# Patient Record
Sex: Female | Born: 1960 | Race: Black or African American | Hispanic: No | Marital: Married | Smoking: Never smoker
Health system: Southern US, Community
[De-identification: ages and names within clinical notes are randomized; demographics above are authoritative.]

## PROBLEM LIST (undated history)

## (undated) DIAGNOSIS — G43909 Migraine, unspecified, not intractable, without status migrainosus: Secondary | ICD-10-CM

## (undated) DIAGNOSIS — T7840XA Allergy, unspecified, initial encounter: Secondary | ICD-10-CM

## (undated) DIAGNOSIS — Z8619 Personal history of other infectious and parasitic diseases: Secondary | ICD-10-CM

## (undated) DIAGNOSIS — R011 Cardiac murmur, unspecified: Secondary | ICD-10-CM

## (undated) DIAGNOSIS — Z5189 Encounter for other specified aftercare: Secondary | ICD-10-CM

## (undated) DIAGNOSIS — M1990 Unspecified osteoarthritis, unspecified site: Secondary | ICD-10-CM

## (undated) DIAGNOSIS — E7800 Pure hypercholesterolemia, unspecified: Secondary | ICD-10-CM

## (undated) HISTORY — DX: Allergy, unspecified, initial encounter: T78.40XA

## (undated) HISTORY — DX: Unspecified osteoarthritis, unspecified site: M19.90

## (undated) HISTORY — PX: TUBAL LIGATION: SHX77

## (undated) HISTORY — PX: EYE SURGERY: SHX253

## (undated) HISTORY — DX: Migraine, unspecified, not intractable, without status migrainosus: G43.909

## (undated) HISTORY — DX: Personal history of other infectious and parasitic diseases: Z86.19

## (undated) HISTORY — DX: Cardiac murmur, unspecified: R01.1

## (undated) HISTORY — DX: Encounter for other specified aftercare: Z51.89

## (undated) HISTORY — DX: Pure hypercholesterolemia, unspecified: E78.00

---

## 2008-06-14 ENCOUNTER — Encounter: Admission: RE | Admit: 2008-06-14 | Discharge: 2008-06-14 | Payer: Self-pay | Admitting: Internal Medicine

## 2008-11-03 ENCOUNTER — Encounter: Admission: RE | Admit: 2008-11-03 | Discharge: 2008-11-03 | Payer: Self-pay | Admitting: Internal Medicine

## 2011-06-07 ENCOUNTER — Ambulatory Visit: Payer: Self-pay | Admitting: Family Medicine

## 2011-07-22 ENCOUNTER — Ambulatory Visit: Payer: Self-pay | Admitting: Family Medicine

## 2011-09-02 ENCOUNTER — Encounter: Payer: Self-pay | Admitting: Obstetrics & Gynecology

## 2011-09-02 DIAGNOSIS — Z01419 Encounter for gynecological examination (general) (routine) without abnormal findings: Secondary | ICD-10-CM

## 2012-01-29 ENCOUNTER — Emergency Department (INDEPENDENT_AMBULATORY_CARE_PROVIDER_SITE_OTHER)
Admission: EM | Admit: 2012-01-29 | Discharge: 2012-01-29 | Disposition: A | Discharge status: Home / Self Care | Payer: Self-pay | Source: Home / Self Care | Attending: Family Medicine | Admitting: Family Medicine

## 2012-01-29 ENCOUNTER — Encounter (HOSPITAL_COMMUNITY): Payer: Self-pay

## 2012-01-29 DIAGNOSIS — S46819A Strain of other muscles, fascia and tendons at shoulder and upper arm level, unspecified arm, initial encounter: Secondary | ICD-10-CM

## 2012-01-29 MED ORDER — HYDROCODONE-ACETAMINOPHEN 5-325 MG PO TABS: ORAL_TABLET | ORAL | Status: AC

## 2012-01-29 MED ORDER — NAPROXEN 375 MG PO TABS: 375.0000 mg | ORAL_TABLET | Freq: Two times a day (BID) | ORAL | Status: DC

## 2012-01-29 MED ORDER — NAPROXEN 375 MG PO TABS: 375.0000 mg | ORAL_TABLET | Freq: Two times a day (BID) | ORAL | Status: AC

## 2012-01-29 NOTE — Discharge Instructions (Signed)
Take medications as directed; use the naproxen for baseline pain control; take the hydrocodone ONLY as needed for pain not relieved by the naproxen, and do not drive or work while taking. Rest the arm when you can; limit your use and lifting of heavy objects for the next 5 to 7 days. You may use your arm for otherwise regular daily activities. Return to care should your symptoms not improve, or worsen in any way, such as numbness, tingling, or weakness.
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
## 2013-05-25 ENCOUNTER — Ambulatory Visit: Payer: Self-pay | Admitting: Obstetrics and Gynecology

## 2016-10-07 ENCOUNTER — Emergency Department
Admission: EM | Admit: 2016-10-07 | Discharge: 2016-10-07 | Disposition: A | Discharge status: Home / Self Care | Payer: BC Managed Care – PPO | Attending: Emergency Medicine | Admitting: Emergency Medicine

## 2016-10-07 ENCOUNTER — Emergency Department: Payer: BC Managed Care – PPO

## 2016-10-07 DIAGNOSIS — J069 Acute upper respiratory infection, unspecified: Secondary | ICD-10-CM | POA: Insufficient documentation

## 2016-10-07 DIAGNOSIS — J40 Bronchitis, not specified as acute or chronic: Secondary | ICD-10-CM

## 2016-10-07 DIAGNOSIS — R0789 Other chest pain: Secondary | ICD-10-CM | POA: Diagnosis present

## 2016-10-07 DIAGNOSIS — Z79899 Other long term (current) drug therapy: Secondary | ICD-10-CM | POA: Insufficient documentation

## 2016-10-07 LAB — BASIC METABOLIC PANEL
ANION GAP: 7 (ref 5–15)
BUN: 12 mg/dL (ref 6–20)
CALCIUM: 9.5 mg/dL (ref 8.9–10.3)
CHLORIDE: 103 mmol/L (ref 101–111)
CO2: 30 mmol/L (ref 22–32)
CREATININE: 0.75 mg/dL (ref 0.44–1.00)
GFR calc Af Amer: >60 mL/min (ref >60)
GLUCOSE: 85 mg/dL (ref 65–99)
POTASSIUM: 4.2 mmol/L (ref 3.5–5.1)
Sodium: 140 mmol/L (ref 135–145)

## 2016-10-07 LAB — TROPONIN I

## 2016-10-07 LAB — CBC
HCT: 37 % (ref 35.0–47.0)
Hemoglobin: 12.5 g/dL (ref 12.0–16.0)
MCH: 29.6 pg (ref 26.0–34.0)
MCHC: 33.9 g/dL (ref 32.0–36.0)
MCV: 87.2 fL (ref 80.0–100.0)
PLATELETS: 274 10*3/uL (ref 150–440)
RBC: 4.24 MIL/uL (ref 3.80–5.20)
RDW: 13.5 % (ref 11.5–14.5)
WBC: 4.1 10*3/uL (ref 3.6–11.0)

## 2016-10-07 MED ORDER — AZITHROMYCIN 250 MG PO TABS: ORAL_TABLET | ORAL | 0 refills | Status: AC

## 2016-10-07 MED ORDER — AZITHROMYCIN 250 MG PO TABS: ORAL_TABLET | ORAL | 0 refills | Status: DC

## 2016-10-07 MED ORDER — GUAIFENESIN-CODEINE 100-10 MG/5ML PO SOLN: 5.0000 mL | Freq: Four times a day (QID) | ORAL | 0 refills | Status: DC | PRN

## 2016-10-07 MED ORDER — BENZONATATE 100 MG PO CAPS: 100.0000 mg | ORAL_CAPSULE | Freq: Four times a day (QID) | ORAL | 0 refills | Status: AC | PRN

## 2016-10-07 NOTE — ED Triage Notes (Signed)
Pt to ed with c/o chest pain, sob, dizziness and cough, congestion since Saturday.  Pt states chest pain and sob would get worse with ambulation or activity.  Pt also reports sinus congestion.

## 2016-10-07 NOTE — ED Provider Notes (Signed)
[facility] Emergency Department Provider Note  Time seen: 12:06 PM  I have reviewed the triage vital signs and the nursing notes.   HISTORY  Chief Complaint Chest Pain    HPI Olivia Pennington is a 55 y.o. female who presents to the emergency department for cough, congestion and chest discomfort. According to the patient for the past 2 weeks she has been coughing with sinus congestion. States over the past several days she has been experiencing chest discomfort as well although denies any chest discomfort yesterday or today. States it feels like a discomfort in her airway per patient. [facility] and was sent to the ER for further evaluation. Denies any shortness of breath, nausea or diaphoresis. Denies any leg pain or swelling. Currently patient appears well with no complaints besides cough/congestion.  No past medical history on file.  There are no active problems to display for this patient.   No past surgical history on file.  Prior to Admission medications   Medication Sig Start Date End Date Taking? Authorizing Provider  calcium carbonate (OS-CAL - DOSED IN MG OF ELEMENTAL CALCIUM) 1250 (500 Ca) MG tablet Take 1 tablet by mouth.   Yes Historical Provider, MD  Chlorpheniramine-DM (CORICIDIN HBP COUGH/COLD PO) Take 1 tablet by mouth daily as needed.   Yes Historical Provider, MD  ferrous sulfate 325 (65 FE) MG EC tablet Take 325 mg by mouth 3 (three) times daily with meals.   Yes Historical Provider, MD  Nutritional Supplements (COLD AND FLU PO) Take 1 tablet by mouth as needed.   Yes Historical Provider, MD  vitamin C (ASCORBIC ACID) 500 MG tablet Take 500 mg by mouth daily.   Yes Historical Provider, MD    No Known Allergies  No family history on file.  Social History Social History  Substance Use Topics  . Smoking status: Not on file  . Smokeless tobacco: Not on file  . Alcohol use Not on file    Review of  Systems Constitutional: Negative for fever. Cardiovascular: Negative for chest pain. Respiratory: Negative for shortness of breath.Positive for cough. Gastrointestinal: Negative for abdominal pain Musculoskeletal: Negative for back pain. Neurological: Negative for headache 10-point ROS otherwise negative.  ____________________________________________   PHYSICAL EXAM:  VITAL SIGNS: ED Triage Vitals  Enc Vitals Group     BP 10/07/16 1000 (!) 175/104     Pulse Rate 10/07/16 1000 63     Resp 10/07/16 1000 20     Temp 10/07/16 1000 98.3 F (36.8 C)     Temp Source 10/07/16 1000 Oral     SpO2 10/07/16 1000 100 %     Weight 10/07/16 0959 160 lb (72.6 kg)     Height 10/07/16 0959 5\' 2"  (1.575 m)     Head Circumference --      Peak Flow --      Pain Score 10/07/16 1000 0     Pain Loc --      Pain Edu? --      Excl. in GC? --     Constitutional: Alert and oriented. Well appearing and in no distress. Eyes: Normal exam ENT   Head: Normocephalic and atraumatic.   Mouth/Throat: Mucous membranes are moist. Cardiovascular: Normal rate, regular rhythm. No murmur Respiratory: Normal respiratory effort without tachypnea nor retractions. Breath sounds are clear Gastrointestinal: Soft and nontender. No distention.   Musculoskeletal: Nontender with normal range of motion in all extremities. No lower extremity tenderness or edema.  Neurologic:  Normal speech and language. No gross focal neurologic deficits  Skin:  Skin is warm, dry and intact.  Psychiatric: Mood and affect are normal.   ____________________________________________    EKG  EKG reviewed and interpreted by myself shows normal sinus rhythm at 64 bpm, narrow QRS, normal axis, normal intervals, nonspecific but no concerning ST changes.  ____________________________________________    RADIOLOGY  Chest x-ray is clear  ____________________________________________   INITIAL IMPRESSION / ASSESSMENT AND PLAN / ED  COURSE  Pertinent labs & imaging results that were available during my care of the patient were reviewed by me and considered in my medical decision making (see chart for details).  Patient presents to the emergency department for cough, congestion, sinus pain/pressure and chest discomfort over the past 2 weeks. Patient's chest x-ray is clear, physical examination is normal with an occasional cough. Labs are within normal limits including a negative troponin. Highly suspect upper respiratory infection/bronchitis. As the patient's symptoms have been ongoing for 2 weeks we'll cover with Zithromax and have the patient follow up with her primary care doctor. Patient agreeable to this plan.  ____________________________________________   FINAL CLINICAL IMPRESSION(S) / ED DIAGNOSES  Acute bronchitis    Minna AntisKevin Rinoa Garramone, MD 10/07/16 1209

## 2017-05-26 ENCOUNTER — Encounter: Payer: Self-pay | Admitting: Obstetrics and Gynecology

## 2017-05-26 ENCOUNTER — Ambulatory Visit (INDEPENDENT_AMBULATORY_CARE_PROVIDER_SITE_OTHER): Payer: BC Managed Care – PPO | Admitting: Obstetrics and Gynecology

## 2017-05-26 VITALS — BP 126/78 | Ht 62.0 in | Wt 176.0 lb

## 2017-05-26 DIAGNOSIS — Z01419 Encounter for gynecological examination (general) (routine) without abnormal findings: Secondary | ICD-10-CM | POA: Diagnosis not present

## 2017-05-26 DIAGNOSIS — Z1339 Encounter for screening examination for other mental health and behavioral disorders: Secondary | ICD-10-CM

## 2017-05-26 DIAGNOSIS — Z124 Encounter for screening for malignant neoplasm of cervix: Secondary | ICD-10-CM | POA: Diagnosis not present

## 2017-05-26 DIAGNOSIS — Z1331 Encounter for screening for depression: Secondary | ICD-10-CM

## 2017-05-26 DIAGNOSIS — Z1389 Encounter for screening for other disorder: Secondary | ICD-10-CM | POA: Diagnosis not present

## 2017-05-26 NOTE — Progress Notes (Signed)
Routine Annual Gynecology Examination   PCP: Patient, No Pcp Per  Chief Complaint  Patient presents with  . Annual Exam    History of Present Illness: Patient is a 56 y.o. [id number] presents for annual exam. The patient has no complaints today.   Menopausal bleeding: no menses since February-March  Menopausal symptoms: reports hot flashes daily, lasting 5-20 minutes. Does not want medication for her symptoms.  Breast symptoms: denies  Last pap smear: 4 years ago.  Result Normal  Last mammogram: 4 years ago.  Result Normal  Past Medical History:  Diagnosis Date  . Hypercholesteremia     Past Surgical History:  Procedure Laterality Date  . CESAREAN SECTION    . EYE SURGERY    . TUBAL LIGATION        Medication Sig Start Date End Date Taking? Authorizing Provider  calcium carbonate (OS-CAL - DOSED IN MG OF ELEMENTAL CALCIUM) 1250 (500 Ca) MG tablet Take 1 tablet by mouth.   Yes [provider]  Nutritional Supplements (COLD AND FLU PO) Take 1 tablet by mouth as needed.   Yes [provider]  vitamin C (ASCORBIC ACID) 500 MG tablet Take 500 mg by mouth daily.   Yes [provider]  Chlorpheniramine-DM (CORICIDIN HBP COUGH/COLD PO) Take 1 tablet by mouth daily as needed.    [provider]  ferrous sulfate 325 (65 FE) MG EC tablet Take 325 mg by mouth 3 (three) times daily with meals.    [provider]   Allergies: No Known Allergies  Obstetric History: [id number]: [id number]  Social History   Social History  . Marital status: Married    Spouse name: N/A  . Number of children: N/A  . Years of education: N/A   Occupational History  . Not on file.   Social History Main Topics  . Smoking status: Never Smoker  . Smokeless tobacco: Never Used  . Alcohol use No  . Drug use: No  . Sexual activity: Yes   Other Topics Concern  . Not on file   Social History Narrative  . No narrative on file    Family History  Problem Relation  Age of Onset  . Colon cancer Father    Review of Systems  Constitutional: Negative for chills, diaphoresis, fever, malaise/fatigue and weight loss.       Hot flashes  HENT: Negative.   Eyes: Negative.   Respiratory: Negative.   Cardiovascular: Negative.   Gastrointestinal: Negative.   Genitourinary: Negative.   Musculoskeletal: Negative.   Skin: Negative.   Neurological: Negative for weakness.  Endo/Heme/Allergies: Negative.   Psychiatric/Behavioral: Negative.      Physical Exam Vitals: BP 126/78   Ht 5\' 2"  (1.575 m)   Wt 176 lb (79.8 kg)   LMP 12/26/2016   BMI 32.19 kg/m   Physical Exam  Constitutional: She is oriented to person, place, and time. She appears well-developed and well-nourished. No distress.  Genitourinary: Vagina normal and uterus normal. Pelvic exam was performed with patient supine. There is no rash, tenderness or lesion on the right labia. There is no rash, tenderness or lesion on the left labia. Right adnexum does not display mass, does not display tenderness and does not display fullness. Left adnexum does not display mass, does not display tenderness and does not display fullness. Cervix does not exhibit motion tenderness, lesion, discharge or polyp.   Uterus is mobile and anteverted. Uterus is not enlarged, tender, exhibiting a mass or irregular (is regular).  HENT:  Head: Normocephalic and atraumatic.  Eyes: EOM are normal. No scleral icterus.  Neck: Normal range of motion. Neck supple. No thyromegaly present.  Cardiovascular: Normal rate and regular rhythm.   Pulmonary/Chest: Effort normal and breath sounds normal. No respiratory distress. She has no wheezes. She has no rales.  Abdominal: Soft. Bowel sounds are normal. She exhibits no distension and no mass. There is no tenderness. There is no rebound and no guarding.  Musculoskeletal: Normal range of motion. She exhibits no edema.  Lymphadenopathy:    She has no cervical adenopathy.  Neurological:  She is alert and oriented to person, place, and time. No cranial nerve deficit.  Skin: Skin is warm and dry. No erythema.  Psychiatric: She has a normal mood and affect. Her behavior is normal. Judgment normal.   Female chaperone present for pelvic and breast  portions of the physical exam  Results: AUDIT Questionnaire (screen for alcoholism): 2 PHQ-9: 0  Assessment and Plan:  56 y.o. [id number] female here for routine annual gynecologic examination  Plan: Problem List Items Addressed This Visit    None    Visit Diagnoses    Women's annual routine gynecological examination    -  Primary   Relevant Orders   IGP, Aptima HPV, rfx 16/18,45   Pap smear for cervical cancer screening       Relevant Orders   IGP, Aptima HPV, rfx 16/18,45   Screening for depression       Screening for alcoholism         Screening: -- Blood pressure screen normal -- Colonoscopy - due - managed by PCP -- Mammogram - due. Patient to call [facility] to arrange. She understands that it is her responsibility to arrange this. -- Weight screening: obese: discussed management options, including lifestyle, dietary, and exercise. -- Depression screening negative (PHQ-9) -- Nutrition: normal -- cholesterol screening: per PCP -- osteoporosis screening: not due -- tobacco screening: not using -- alcohol screening: AUDIT questionnaire indicates low-risk usage. -- family history of breast cancer screening: done. not at high risk. -- no evidence of domestic violence or intimate partner violence. -- STD screening: gonorrhea/chlamydia NAAT not collected per patient request. -- pap smear collected per ASCCP guidelines -- HPV vaccination series: not eligilbe  Thomasene Mohair, MD 05/26/2017 5:24 PM

## 2017-05-28 ENCOUNTER — Other Ambulatory Visit: Payer: Self-pay | Admitting: Obstetrics and Gynecology

## 2017-05-28 DIAGNOSIS — Z1231 Encounter for screening mammogram for malignant neoplasm of breast: Secondary | ICD-10-CM

## 2017-05-30 LAB — IGP, APTIMA HPV, RFX 16/18,45
HPV Aptima: NEGATIVE
PAP SMEAR COMMENT: 0

## 2017-06-02 ENCOUNTER — Encounter: Payer: Self-pay | Admitting: Obstetrics and Gynecology

## 2017-06-16 ENCOUNTER — Ambulatory Visit
Admission: RE | Admit: 2017-06-16 | Discharge: 2017-06-16 | Disposition: A | Discharge status: Home / Self Care | Payer: BC Managed Care – PPO | Source: Ambulatory Visit | Attending: Obstetrics and Gynecology | Admitting: Obstetrics and Gynecology

## 2017-06-16 DIAGNOSIS — Z1231 Encounter for screening mammogram for malignant neoplasm of breast: Secondary | ICD-10-CM | POA: Diagnosis not present

## 2018-05-14 ENCOUNTER — Other Ambulatory Visit: Payer: Self-pay | Admitting: Obstetrics and Gynecology

## 2018-06-08 ENCOUNTER — Encounter: Payer: Self-pay | Admitting: Obstetrics and Gynecology

## 2018-06-08 ENCOUNTER — Other Ambulatory Visit (HOSPITAL_COMMUNITY)
Admission: RE | Admit: 2018-06-08 | Discharge: 2018-06-08 | Disposition: A | Discharge status: Home / Self Care | Payer: BC Managed Care – PPO | Source: Ambulatory Visit | Attending: Obstetrics and Gynecology | Admitting: Obstetrics and Gynecology

## 2018-06-08 ENCOUNTER — Ambulatory Visit (INDEPENDENT_AMBULATORY_CARE_PROVIDER_SITE_OTHER): Payer: BC Managed Care – PPO | Admitting: Obstetrics and Gynecology

## 2018-06-08 VITALS — Ht 62.0 in | Wt 174.0 lb

## 2018-06-08 DIAGNOSIS — E7800 Pure hypercholesterolemia, unspecified: Secondary | ICD-10-CM | POA: Insufficient documentation

## 2018-06-08 DIAGNOSIS — Z01419 Encounter for gynecological examination (general) (routine) without abnormal findings: Secondary | ICD-10-CM | POA: Diagnosis present

## 2018-06-08 DIAGNOSIS — Z1331 Encounter for screening for depression: Secondary | ICD-10-CM | POA: Diagnosis not present

## 2018-06-08 DIAGNOSIS — Z124 Encounter for screening for malignant neoplasm of cervix: Secondary | ICD-10-CM

## 2018-06-08 DIAGNOSIS — Z1339 Encounter for screening examination for other mental health and behavioral disorders: Secondary | ICD-10-CM

## 2018-06-08 DIAGNOSIS — Z1211 Encounter for screening for malignant neoplasm of colon: Secondary | ICD-10-CM

## 2018-06-08 NOTE — Progress Notes (Signed)
Routine Annual Gynecology Examination   PCP: [facility]  Chief Complaint  Patient presents with  . Gynecologic Exam   History of Present Illness: Patient is a 57 y.o. [id number] presents for annual exam. The patient has no complaints today.   Menopausal bleeding: No menses in about 9 months.    Menopausal symptoms: hot flashes x 3 years.  She gets these every day and night.  She sleeps well.   She believes her husband might say she has irritability.   Breast symptoms: denies  Last pap smear: 1 years ago.  Result Normal, HPV negative  Last mammogram: 1 year ago.  Result Normal  Colon cancer screening: has not had yet. She canceled her appointment because she did not have a person to take her to the hospital.    Past Medical History:  Diagnosis Date  . Hypercholesteremia     Past Surgical History:  Procedure Laterality Date  . CESAREAN SECTION    . EYE SURGERY    . TUBAL LIGATION      Medications   Medication Sig Start Date End Date Taking? Authorizing Provider  Multiple Vitamin (MULTIVITAMINS PO) Take by mouth.   Yes [provider]   Allergies: No Known Allergies  Obstetric History: [id number]  Social History   Socioeconomic History  . Marital status: Married    Spouse name: Not on file  . Number of children: Not on file  . Years of education: Not on file  . Highest education level: Not on file  Occupational History  . Not on file  Social Needs  . Financial resource strain: Not on file  . Food insecurity:    Worry: Not on file    Inability: Not on file  . Transportation needs:    Medical: Not on file    Non-medical: Not on file  Tobacco Use  . Smoking status: Never Smoker  . Smokeless tobacco: Never Used  Substance and Sexual Activity  . Alcohol use: No  . Drug use: No  . Sexual activity: Yes  Lifestyle  . Physical activity:    Days per week: 5 days    Minutes per session: Not on file  . Stress: Not on file  Relationships    . Social connections:    Talks on phone: Not on file    Gets together: Not on file    Attends religious service: Not on file    Active member of club or organization: Not on file    Attends meetings of clubs or organizations: Not on file    Relationship status: Not on file  . Intimate partner violence:    Fear of current or ex partner: Not on file    Emotionally abused: Not on file    Physically abused: Not on file    Forced sexual activity: Not on file  Other Topics Concern  . Not on file  Social History Narrative  . Not on file    Family History  Problem Relation Age of Onset  . Colon cancer Father   . Breast cancer Neg Hx     Review of Systems  Constitutional: Negative.   HENT: Negative.   Eyes: Negative.   Respiratory: Negative.   Cardiovascular: Negative.   Gastrointestinal: Negative.   Genitourinary: Negative.   Musculoskeletal: Negative.   Skin: Negative.   Neurological: Negative.   Psychiatric/Behavioral: Negative.      Physical Exam Vitals: Ht 5\' 2"  (1.575 m)   Wt 174 lb (78.9 kg)  BMI 31.83 kg/m   Physical Exam  Constitutional: She is oriented to person, place, and time. She appears well-developed and well-nourished. No distress.  Genitourinary: Uterus normal. Pelvic exam was performed with patient supine. There is no rash, tenderness, lesion or injury on the right labia. There is no rash, tenderness, lesion or injury on the left labia. No erythema, tenderness or bleeding in the vagina. No signs of injury around the vagina. No vaginal discharge found. Right adnexum does not display mass, does not display tenderness and does not display fullness. Left adnexum does not display mass, does not display tenderness and does not display fullness. Cervix does not exhibit motion tenderness, lesion, discharge or polyp.   Uterus is mobile and anteverted. Uterus is not enlarged, tender or exhibiting a mass.  HENT:  Head: Normocephalic and atraumatic.  Eyes: EOM are  normal. No scleral icterus.  Neck: Normal range of motion. Neck supple. No thyromegaly present.  Cardiovascular: Normal rate and regular rhythm. Exam reveals no gallop and no friction rub.  No murmur heard. Pulmonary/Chest: Effort normal and breath sounds normal. No respiratory distress. She has no wheezes. She has no rales. Right breast exhibits no inverted nipple, no mass, no nipple discharge, no skin change and no tenderness. Left breast exhibits no inverted nipple, no mass, no nipple discharge, no skin change and no tenderness.  Abdominal: Soft. Bowel sounds are normal. She exhibits no distension and no mass. There is no tenderness. There is no rebound and no guarding.  Musculoskeletal: Normal range of motion. She exhibits no edema or tenderness.  Lymphadenopathy:    She has no cervical adenopathy.       Right: No inguinal adenopathy present.       Left: No inguinal adenopathy present.  Neurological: She is alert and oriented to person, place, and time. No cranial nerve deficit.  Skin: Skin is warm and dry. No rash noted. No erythema.  Psychiatric: She has a normal mood and affect. Her behavior is normal. Judgment normal.   Female chaperone present for pelvic and breast  portions of the physical exam  Results: AUDIT Questionnaire (screen for alcoholism): 1 PHQ-9: 0  Assessment and Plan:  57 y.o. [id number] female here for routine annual gynecologic examination  Plan: Problem List Items Addressed This Visit    None    Visit Diagnoses    Women's annual routine gynecological examination    -  Primary   Relevant Orders   Cytology - PAP   Screening for depression       Screening for alcoholism       Colon cancer screening       Pap smear for cervical cancer screening       Relevant Orders   Cytology - PAP    Cologuard ordered for patient.   Screening: -- Blood pressure screen managed by PCP -- Colonoscopy - discussed in detail. Discussed Cologard as alternative. Given her  family history, I recommended Colonoscopy.   -- Mammogram - due. Patient to call [facility] to arrange. She understands that it is her responsibility to arrange this. -- Weight screening: obese: discussed management options, including lifestyle, dietary, and exercise. -- Depression screening negative (PHQ-9) -- Nutrition: normal -- cholesterol screening: per PCP -- osteoporosis screening: not due -- tobacco screening: not using -- alcohol screening: AUDIT questionnaire indicates low-risk usage. -- family history of breast cancer screening: done. not at high risk. -- no evidence of domestic violence or intimate partner violence. -- STD screening: gonorrhea/chlamydia NAAT not  collected per patient request. -- pap smear not collected per ASCCP guidelines -- HPV vaccination series: not eligilbe  Thomasene Mohair, MD 06/08/2018 10:18 AM

## 2018-06-09 ENCOUNTER — Other Ambulatory Visit: Payer: Self-pay | Admitting: Obstetrics and Gynecology

## 2018-06-09 DIAGNOSIS — Z1231 Encounter for screening mammogram for malignant neoplasm of breast: Secondary | ICD-10-CM

## 2018-06-10 ENCOUNTER — Encounter: Payer: Self-pay | Admitting: Obstetrics and Gynecology

## 2018-06-10 LAB — CYTOLOGY - PAP
Diagnosis: NEGATIVE
HPV: NOT DETECTED

## 2018-06-30 ENCOUNTER — Telehealth: Payer: Self-pay

## 2018-06-30 ENCOUNTER — Telehealth: Payer: Self-pay | Admitting: Obstetrics and Gynecology

## 2018-06-30 NOTE — Telephone Encounter (Signed)
I did receive the results and have left a voicemail for the patient to call me back to discuss next steps.

## 2018-06-30 NOTE — Telephone Encounter (Signed)
[employer] calling to confirm you received the ABNORMAL/POSITIVE Cologard results that were faxed on this patient the end of last week.

## 2018-06-30 NOTE — Telephone Encounter (Signed)
Left generic VM for patient to call me back. 

## 2018-07-01 NOTE — Telephone Encounter (Signed)
Patient is calling to speak with dr. Jean RosenthalJackson. Please advise

## 2018-07-02 NOTE — Telephone Encounter (Signed)
Patient is calling to let Dr. Jean RosenthalJackson know the best time to reach her is anytime between 10:30-1:30 and after 4.

## 2018-07-09 NOTE — Telephone Encounter (Signed)
This was sent to me while I was still on maternity leave, have you spoke with pt?

## 2018-07-09 NOTE — Telephone Encounter (Signed)
Spoke with patient. Discussed positive Cologuard result.  Strongly recommend colonoscopy. She would like to have it done at [facility]. She states that she will call and schedule. I offered and encouraged her to let me make a referral.  She states that if after speaking with [facility], she needs a referral, she will let me know.

## 2018-07-23 NOTE — Telephone Encounter (Signed)
Spoke with pt. Pt will call [facility] to schedule colonoscopy and will let us know if she needs referral

## 2018-07-23 NOTE — Telephone Encounter (Signed)
Patient is calling due to multiple messages left. Patient would like to have Dr. Jean RosenthalJackson Nurse call her back to help her understand the next steps for her to schedule her Colonoscopy. Please advise

## 2018-11-23 ENCOUNTER — Ambulatory Visit
Admission: RE | Admit: 2018-11-23 | Discharge: 2018-11-23 | Disposition: A | Discharge status: Home / Self Care | Payer: BC Managed Care – PPO | Source: Ambulatory Visit | Attending: Obstetrics and Gynecology | Admitting: Obstetrics and Gynecology

## 2018-11-23 DIAGNOSIS — Z1231 Encounter for screening mammogram for malignant neoplasm of breast: Secondary | ICD-10-CM | POA: Diagnosis present

## 2020-05-22 ENCOUNTER — Ambulatory Visit: Payer: BC Managed Care – PPO | Admitting: Obstetrics and Gynecology

## 2020-06-26 ENCOUNTER — Ambulatory Visit: Payer: BC Managed Care – PPO | Admitting: Obstetrics and Gynecology

## 2020-07-05 ENCOUNTER — Ambulatory Visit: Payer: BC Managed Care – PPO | Admitting: Obstetrics and Gynecology

## 2020-08-04 ENCOUNTER — Ambulatory Visit (INDEPENDENT_AMBULATORY_CARE_PROVIDER_SITE_OTHER): Payer: BC Managed Care – PPO | Admitting: Obstetrics and Gynecology

## 2020-08-04 ENCOUNTER — Other Ambulatory Visit: Payer: Self-pay

## 2020-08-04 ENCOUNTER — Encounter: Payer: Self-pay | Admitting: Obstetrics and Gynecology

## 2020-08-04 VITALS — BP 130/90 | Ht 62.0 in | Wt 175.0 lb

## 2020-08-04 DIAGNOSIS — Z01419 Encounter for gynecological examination (general) (routine) without abnormal findings: Secondary | ICD-10-CM | POA: Diagnosis not present

## 2020-08-04 DIAGNOSIS — Z1339 Encounter for screening examination for other mental health and behavioral disorders: Secondary | ICD-10-CM

## 2020-08-04 DIAGNOSIS — Z1331 Encounter for screening for depression: Secondary | ICD-10-CM

## 2020-08-04 NOTE — Progress Notes (Signed)
Routine Annual Gynecology Examination   PCP: Patient, No Pcp Per  Chief Complaint  Patient presents with  . Gynecologic Exam    History of Present Illness: Patient is a 59 y.o. [id number] presents for annual exam. The patient has no complaints today.   Menopausal bleeding: none  Menopausal symptoms: hot flashes, night sweats  Breast symptoms: denies  Last pap smear: 2 years ago.  Result Normal  Last mammogram: 11/2018 years ago.  Result Normal   Last colonoscopy: 2 years ago.  Per patient 10 year follow up.   Past Medical History:  Diagnosis Date  . Hypercholesteremia     Past Surgical History:  Procedure Laterality Date  . CESAREAN SECTION    . EYE SURGERY    . TUBAL LIGATION     Prior to Admission medications   Medication Sig Start Date End Date Taking? Authorizing Provider  Multiple Vitamin (MULTIVITAMINS PO) Take by mouth.   Yes [provider]   Allergies: No Known Allergies  Obstetric History: [id number]  Social History   Socioeconomic History  . Marital status: Married    Spouse name: Not on file  . Number of children: Not on file  . Years of education: Not on file  . Highest education level: Not on file  Occupational History  . Not on file  Tobacco Use  . Smoking status: Never Smoker  . Smokeless tobacco: Never Used  Vaping Use  . Vaping Use: Never used  Substance and Sexual Activity  . Alcohol use: No  . Drug use: No  . Sexual activity: Yes  Other Topics Concern  . Not on file  Social History Narrative  . Not on file   Social Determinants of Health   Financial Resource Strain:   . Difficulty of Paying Living Expenses: Not on file  Food Insecurity:   . Worried About Programme researcher, broadcasting/film/video in the Last Year: Not on file  . Ran Out of Food in the Last Year: Not on file  Transportation Needs:   . Lack of Transportation (Medical): Not on file  . Lack of Transportation (Non-Medical): Not on file  Physical Activity:   . Days of  Exercise per Week: Not on file  . Minutes of Exercise per Session: Not on file  Stress:   . Feeling of Stress : Not on file  Social Connections:   . Frequency of Communication with Friends and Family: Not on file  . Frequency of Social Gatherings with Friends and Family: Not on file  . Attends Religious Services: Not on file  . Active Member of Clubs or Organizations: Not on file  . Attends Banker Meetings: Not on file  . Marital Status: Not on file  Intimate Partner Violence:   . Fear of Current or Ex-Partner: Not on file  . Emotionally Abused: Not on file  . Physically Abused: Not on file  . Sexually Abused: Not on file    Family History  Problem Relation Age of Onset  . Colon cancer Father   . Breast cancer Neg Hx     Review of Systems  Constitutional: Negative.   HENT: Negative.   Eyes: Negative.   Respiratory: Negative.   Cardiovascular: Negative.   Gastrointestinal: Negative.   Genitourinary: Negative.   Musculoskeletal: Negative.   Skin: Negative.   Neurological: Negative.   Psychiatric/Behavioral: Negative.      Physical Exam Vitals: BP 130/90   Ht 5\' 2"  (1.575 m)   Wt 175 lb (79.4  kg)   BMI 32.01 kg/m   Physical Exam Constitutional:      General: She is not in acute distress.    Appearance: Normal appearance. She is well-developed.  Genitourinary:     Pelvic exam was performed with patient in the lithotomy position.     Vulva, urethra, bladder and uterus normal.     No inguinal adenopathy present in the right or left side.    No signs of injury in the vagina.     No vaginal discharge, erythema, tenderness or bleeding.     No cervical motion tenderness, discharge, lesion or polyp.     Uterus is mobile.     Uterus is not enlarged or tender.     No uterine mass detected.    Uterus is anteverted.     No right or left adnexal mass present.     Right adnexa not tender or full.     Left adnexa not tender or full.  HENT:     Head:  Normocephalic and atraumatic.  Eyes:     General: No scleral icterus.    Conjunctiva/sclera: Conjunctivae normal.  Neck:     Thyroid: No thyromegaly.  Cardiovascular:     Rate and Rhythm: Normal rate and regular rhythm.     Heart sounds: No murmur heard.  No friction rub. No gallop.   Pulmonary:     Effort: Pulmonary effort is normal. No respiratory distress.     Breath sounds: Normal breath sounds. No wheezing or rales.  Chest:     Breasts:        Right: No inverted nipple, mass, nipple discharge, skin change or tenderness.        Left: No inverted nipple, mass, nipple discharge, skin change or tenderness.  Abdominal:     General: Bowel sounds are normal. There is no distension.     Palpations: Abdomen is soft. There is no mass.     Tenderness: There is no abdominal tenderness. There is no guarding or rebound.  Musculoskeletal:        General: No swelling or tenderness. Normal range of motion.     Cervical back: Normal range of motion and neck supple.  Lymphadenopathy:     Cervical: No cervical adenopathy.     Lower Body: No right inguinal adenopathy. No left inguinal adenopathy.  Neurological:     General: No focal deficit present.     Mental Status: She is alert and oriented to person, place, and time.     Cranial Nerves: No cranial nerve deficit.  Skin:    General: Skin is warm and dry.     Findings: No erythema or rash.  Psychiatric:        Mood and Affect: Mood normal.        Behavior: Behavior normal.        Judgment: Judgment normal.      Female chaperone present for pelvic and breast  portions of the physical exam  Results: AUDIT Questionnaire (screen for alcoholism): 1 PHQ-9: 0   Assessment and Plan:  59 y.o. [id number] female here for routine annual gynecologic examination  Plan: Problem List Items Addressed This Visit    None    Visit Diagnoses    Women's annual routine gynecological examination    -  Primary   Screening for depression        Screening for alcoholism          Screening: -- Blood pressure screen elevated: continued to monitor. --  Colonoscopy - not due -- Mammogram - due. Patient to call [facility] to arrange. She understands that it is her responsibility to arrange this. -- Weight screening: overweight: continue to monitor -- Depression screening negative (PHQ-9) -- Nutrition: normal -- cholesterol screening: per PCP -- osteoporosis screening: not due -- tobacco screening: not using -- alcohol screening: AUDIT questionnaire indicates low-risk usage. -- family history of breast cancer screening: done. not at high risk. -- no evidence of domestic violence or intimate partner violence. -- STD screening: gonorrhea/chlamydia NAAT not collected per patient request. -- pap smear not collected per ASCCP guidelines -- flu vaccine will recieve -- has received COVID19 vaccine.   Thomasene Mohair, MD 08/04/2020 9:14 AM

## 2020-11-24 ENCOUNTER — Other Ambulatory Visit: Payer: Self-pay | Admitting: Obstetrics and Gynecology

## 2020-11-24 DIAGNOSIS — Z1231 Encounter for screening mammogram for malignant neoplasm of breast: Secondary | ICD-10-CM

## 2020-12-11 ENCOUNTER — Other Ambulatory Visit: Payer: Self-pay

## 2020-12-11 ENCOUNTER — Ambulatory Visit
Admission: RE | Admit: 2020-12-11 | Discharge: 2020-12-11 | Disposition: A | Discharge status: Home / Self Care | Payer: BC Managed Care – PPO | Source: Ambulatory Visit | Attending: Obstetrics and Gynecology | Admitting: Obstetrics and Gynecology

## 2020-12-11 DIAGNOSIS — Z1231 Encounter for screening mammogram for malignant neoplasm of breast: Secondary | ICD-10-CM | POA: Diagnosis not present

## 2021-09-03 ENCOUNTER — Other Ambulatory Visit: Payer: Self-pay

## 2021-09-03 ENCOUNTER — Encounter: Payer: Self-pay | Admitting: Obstetrics and Gynecology

## 2021-09-03 ENCOUNTER — Ambulatory Visit (INDEPENDENT_AMBULATORY_CARE_PROVIDER_SITE_OTHER): Payer: BC Managed Care – PPO | Admitting: Obstetrics and Gynecology

## 2021-09-03 VITALS — HR 72 | Ht 62.0 in | Wt 172.0 lb

## 2021-09-03 DIAGNOSIS — Z1339 Encounter for screening examination for other mental health and behavioral disorders: Secondary | ICD-10-CM

## 2021-09-03 DIAGNOSIS — Z01419 Encounter for gynecological examination (general) (routine) without abnormal findings: Secondary | ICD-10-CM | POA: Diagnosis not present

## 2021-09-03 DIAGNOSIS — Z1331 Encounter for screening for depression: Secondary | ICD-10-CM | POA: Diagnosis not present

## 2021-09-03 NOTE — Progress Notes (Signed)
Routine Annual Gynecology Examination   PCP: Patient, No Pcp Per (Inactive)  Chief Complaint  Patient presents with   Annual Exam   History of Present Illness: Patient is a 60 y.o. [id number] presents for annual exam. The patient has no complaints today.   Menopausal bleeding: none  Menopausal symptoms: hot flashes, night sweats  Breast symptoms: denies  Last pap smear: 3 years ago.  Result Normal  Last mammogram: 12/2020.  Result Normal   Last colonoscopy: 3 years ago.  Per patient 10 year follow up.   Past Medical History:  Diagnosis Date   Hypercholesteremia     Past Surgical History:  Procedure Laterality Date   CESAREAN SECTION     EYE SURGERY     TUBAL LIGATION     Prior to Admission medications   Medication Sig Start Date End Date Taking? Authorizing Provider  Multiple Vitamin (MULTIVITAMINS PO) Take by mouth.   Yes [provider]   Allergies: No Known Allergies  Obstetric History: [id number]  Social History   Socioeconomic History   Marital status: Married    Spouse name: Not on file   Number of children: Not on file   Years of education: Not on file   Highest education level: Not on file  Occupational History   Not on file  Tobacco Use   Smoking status: Never   Smokeless tobacco: Never  Vaping Use   Vaping Use: Never used  Substance and Sexual Activity   Alcohol use: No   Drug use: No   Sexual activity: Yes  Other Topics Concern   Not on file  Social History Narrative   Not on file   Social Determinants of Health   Financial Resource Strain: Not on file  Food Insecurity: Not on file  Transportation Needs: Not on file  Physical Activity: Not on file  Stress: Not on file  Social Connections: Not on file  Intimate Partner Violence: Not on file    Family History  Problem Relation Age of Onset   Colon cancer Father    Breast cancer Neg Hx     Review of Systems  Constitutional: Negative.   HENT: Negative.    Eyes:  Negative.   Respiratory: Negative.    Cardiovascular: Negative.   Gastrointestinal: Negative.   Genitourinary: Negative.   Musculoskeletal: Negative.   Skin: Negative.   Neurological: Negative.   Psychiatric/Behavioral: Negative.      Physical Exam Vitals: Pulse 72   Ht 5\' 2"  (1.575 m)   Wt 172 lb (78 kg)   BMI 31.46 kg/m   Physical Exam Constitutional:      General: She is not in acute distress.    Appearance: Normal appearance. She is well-developed.  Genitourinary:     Vulva and bladder normal.     No vaginal discharge, erythema, tenderness or bleeding.      Right Adnexa: not tender, not full and no mass present.    Left Adnexa: not tender, not full and no mass present.    No cervical motion tenderness, discharge, lesion or polyp.     Uterus is not enlarged or tender.     No uterine mass detected.    Pelvic exam was performed with patient in the lithotomy position.  Breasts:    Right: No inverted nipple, mass, nipple discharge, skin change or tenderness.     Left: No inverted nipple, mass, nipple discharge, skin change or tenderness.  HENT:     Head: Normocephalic and atraumatic.  Eyes:     General: No scleral icterus.    Conjunctiva/sclera: Conjunctivae normal.  Neck:     Thyroid: No thyromegaly.  Cardiovascular:     Rate and Rhythm: Normal rate and regular rhythm.     Heart sounds: No murmur heard.   No friction rub. No gallop.  Pulmonary:     Effort: Pulmonary effort is normal. No respiratory distress.     Breath sounds: Normal breath sounds. No wheezing or rales.  Abdominal:     General: Bowel sounds are normal. There is no distension.     Palpations: Abdomen is soft. There is no mass.     Tenderness: There is no abdominal tenderness. There is no guarding or rebound.  Musculoskeletal:        General: No swelling or tenderness. Normal range of motion.     Cervical back: Normal range of motion and neck supple.  Lymphadenopathy:     Cervical: No cervical  adenopathy.     Lower Body: No right inguinal adenopathy. No left inguinal adenopathy.  Neurological:     General: No focal deficit present.     Mental Status: She is alert and oriented to person, place, and time.     Cranial Nerves: No cranial nerve deficit.  Skin:    General: Skin is warm and dry.     Findings: No erythema or rash.  Psychiatric:        Mood and Affect: Mood normal.        Behavior: Behavior normal.        Judgment: Judgment normal.     Female chaperone present for pelvic and breast  portions of the physical exam  Results: AUDIT Questionnaire (screen for alcoholism): 1 PHQ-9: 0   Assessment and Plan:  60 y.o. [id number] female here for routine annual gynecologic examination  Plan: Problem List Items Addressed This Visit   None Visit Diagnoses     Women's annual routine gynecological examination    -  Primary   Screening for depression       Screening for alcoholism          Screening: -- Blood pressure screen elevated: continued to monitor. -- Colonoscopy - not due -- Mammogram - not due -- Weight screening: overweight: continue to monitor -- Depression screening negative (PHQ-9) -- Nutrition: normal -- cholesterol screening: per PCP -- osteoporosis screening: not due -- tobacco screening: not using -- alcohol screening: AUDIT questionnaire indicates low-risk usage. -- family history of breast cancer screening: done. not at high risk. -- no evidence of domestic violence or intimate partner violence. -- STD screening: gonorrhea/chlamydia NAAT not collected per patient request. -- pap smear not collected per ASCCP guidelines -- flu vaccine will receive  -- has received COVID19 vaccine.   Thomasene Mohair, MD 09/03/2021 10:03 AM

## 2022-05-02 ENCOUNTER — Encounter: Payer: Self-pay | Admitting: Emergency Medicine

## 2022-05-02 ENCOUNTER — Emergency Department: Payer: BC Managed Care – PPO

## 2022-05-02 ENCOUNTER — Emergency Department
Admission: EM | Admit: 2022-05-02 | Discharge: 2022-05-02 | Discharge status: Against Medical Advice | Payer: BC Managed Care – PPO | Attending: Emergency Medicine | Admitting: Emergency Medicine

## 2022-05-02 DIAGNOSIS — R0602 Shortness of breath: Secondary | ICD-10-CM | POA: Insufficient documentation

## 2022-05-02 DIAGNOSIS — K30 Functional dyspepsia: Secondary | ICD-10-CM | POA: Diagnosis not present

## 2022-05-02 DIAGNOSIS — Z5321 Procedure and treatment not carried out due to patient leaving prior to being seen by health care provider: Secondary | ICD-10-CM | POA: Diagnosis not present

## 2022-05-02 DIAGNOSIS — R079 Chest pain, unspecified: Secondary | ICD-10-CM | POA: Diagnosis present

## 2022-05-02 DIAGNOSIS — R42 Dizziness and giddiness: Secondary | ICD-10-CM | POA: Diagnosis not present

## 2022-05-02 LAB — CBG MONITORING, ED: Glucose-Capillary: 125 mg/dL — ABNORMAL HIGH (ref 70–99)

## 2022-05-02 LAB — CBC
HCT: 40.2 % (ref 36.0–46.0)
Hemoglobin: 12.5 g/dL (ref 12.0–15.0)
MCH: 28.2 pg (ref 26.0–34.0)
MCHC: 31.1 g/dL (ref 30.0–36.0)
MCV: 90.5 fL (ref 80.0–100.0)
Platelets: 294 10*3/uL (ref 150–400)
RBC: 4.44 MIL/uL (ref 3.87–5.11)
RDW: 12.4 % (ref 11.5–15.5)
WBC: 5.4 10*3/uL (ref 4.0–10.5)
nRBC: 0 % (ref 0.0–0.2)

## 2022-05-02 LAB — BASIC METABOLIC PANEL
Anion gap: 9 (ref 5–15)
BUN: 18 mg/dL (ref 6–20)
CO2: 27 mmol/L (ref 22–32)
Calcium: 9.1 mg/dL (ref 8.9–10.3)
Chloride: 104 mmol/L (ref 98–111)
Creatinine, Ser: 0.94 mg/dL (ref 0.44–1.00)
GFR, Estimated: >60 mL/min (ref >60)
Glucose, Bld: 119 mg/dL — ABNORMAL HIGH (ref 70–99)
Potassium: 4.1 mmol/L (ref 3.5–5.1)
Sodium: 140 mmol/L (ref 135–145)

## 2022-05-02 LAB — TROPONIN I (HIGH SENSITIVITY): Troponin I (High Sensitivity): 5 ng/L (ref <18)

## 2022-05-02 NOTE — ED Triage Notes (Signed)
Pt presents via POV with complaints of left sided CP described as "stabbing/sharp" that started 20-30 mins ago. Pt states the pain started while she was sitting down watching TV - the pain resolved and she got up to go walk the dog and felt light headed with associated SOB. Pt endorses indigestion with excess belching today.

## 2022-11-16 IMAGING — MG MM DIGITAL SCREENING BILAT W/ TOMO AND CAD
8 series · 8 of 24 positions shown · non-contrast
Comparison: Previous exam(s).

CLINICAL DATA: Screening.

EXAM:
DIGITAL SCREENING BILATERAL MAMMOGRAM WITH TOMOSYNTHESIS AND CAD
TECHNIQUE: Bilateral screening digital craniocaudal and mediolateral oblique
mammograms were obtained. Bilateral screening digital breast
tomosynthesis was performed. The images were evaluated with
computer-aided detection.

[R CC synth-2D]
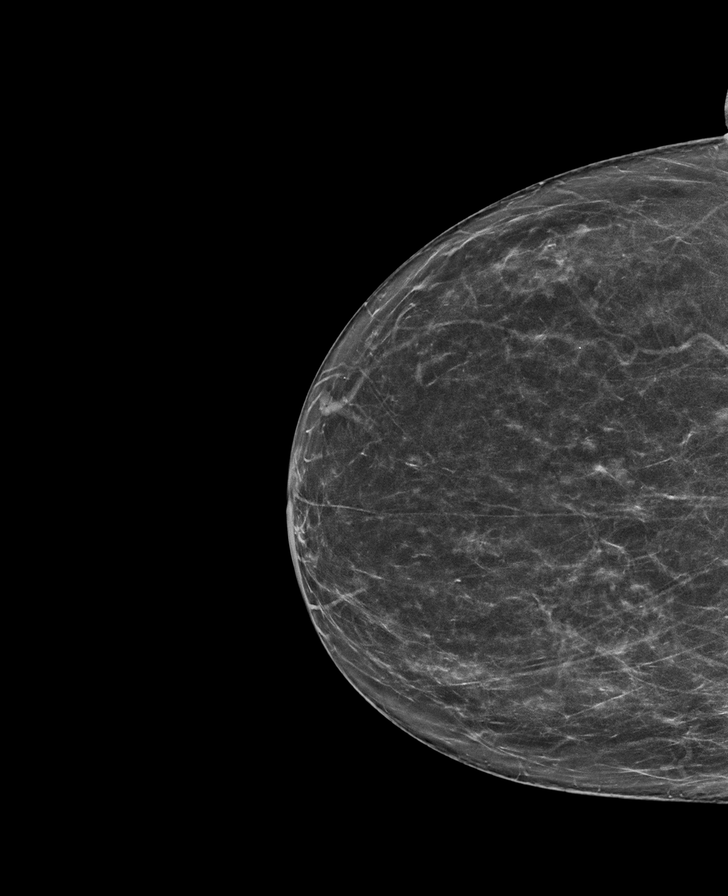

[R MLO synth-2D]
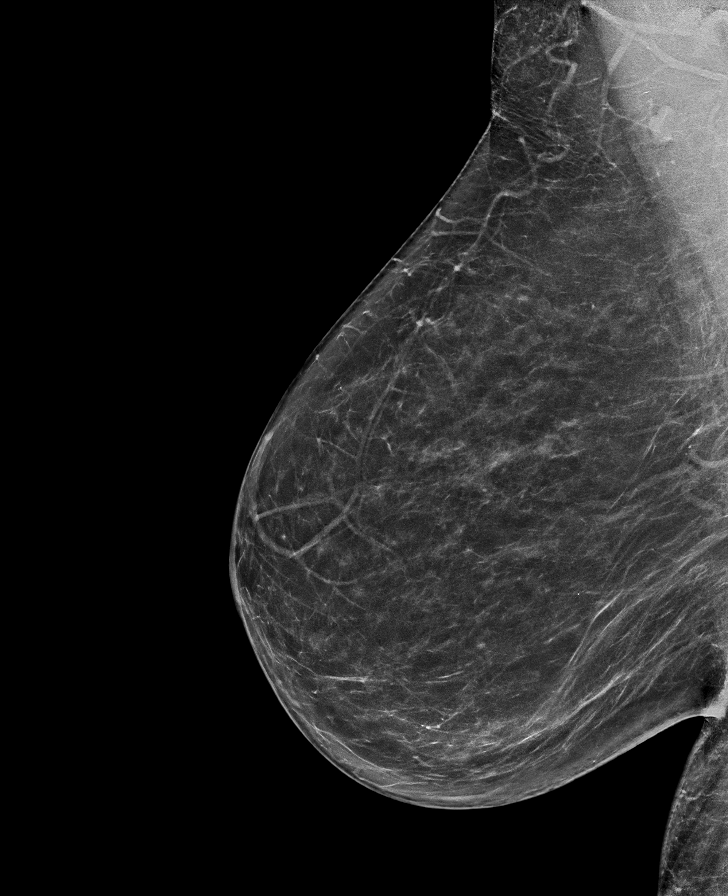

[L MLO synth-2D]
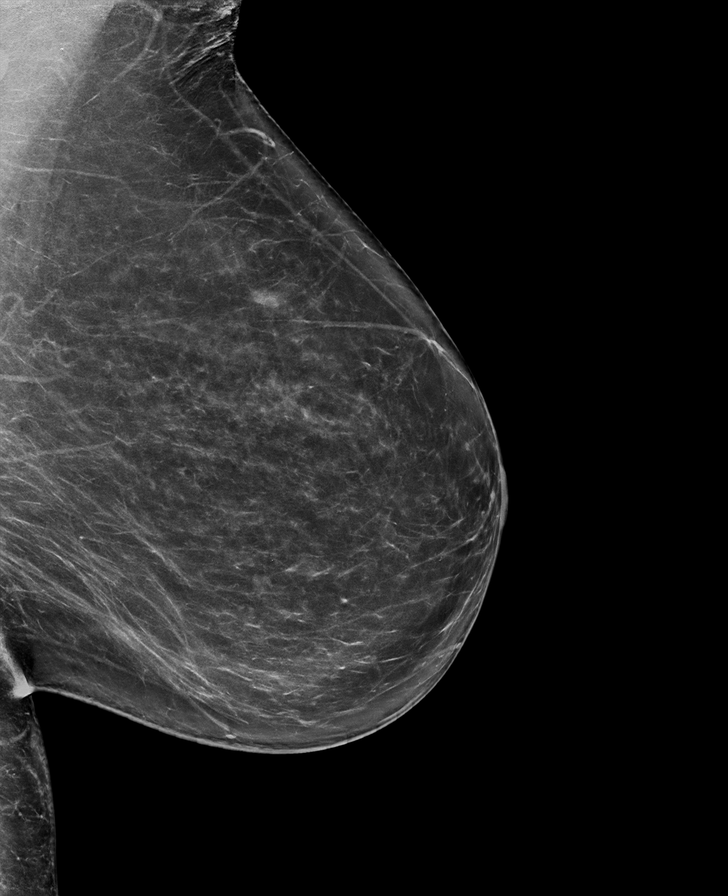

[L CC synth-2D]
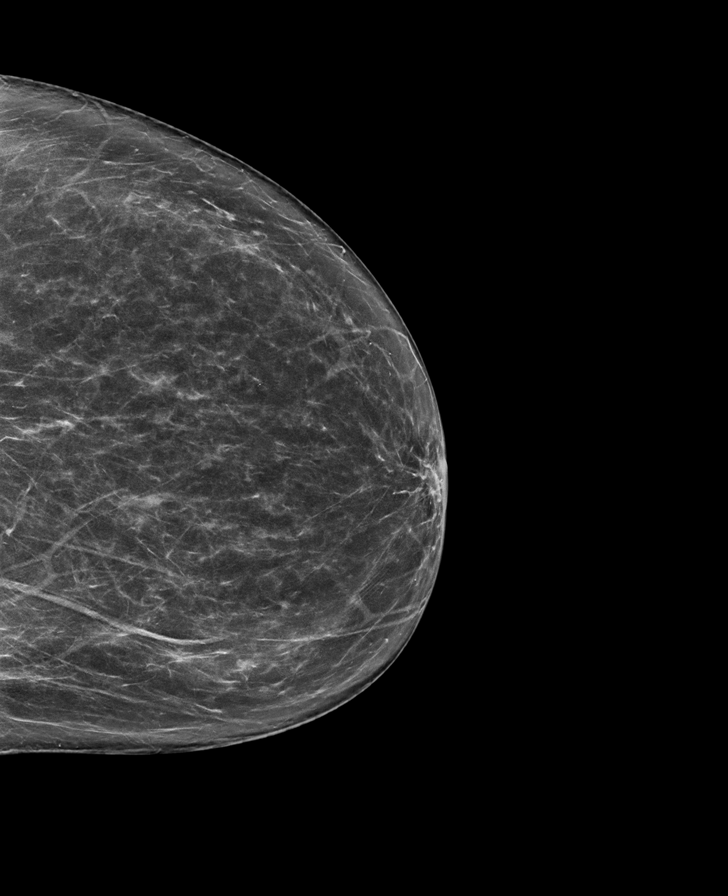

[L CC tomo · tomo slice 40/79.0]
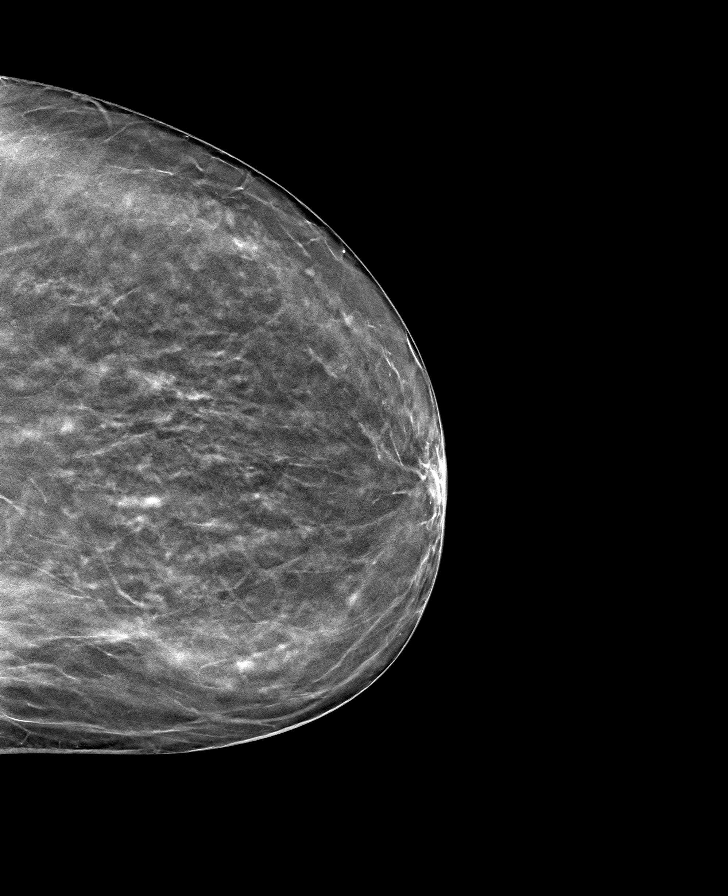

[R MLO tomo · tomo slice 38/75.0]
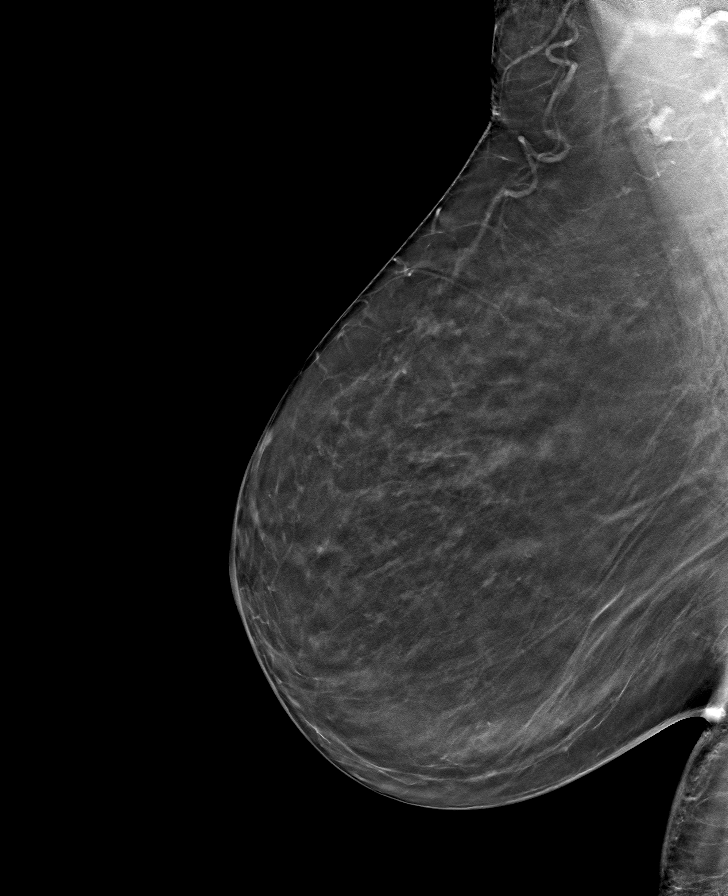

[L MLO tomo · tomo slice 43/85.0]
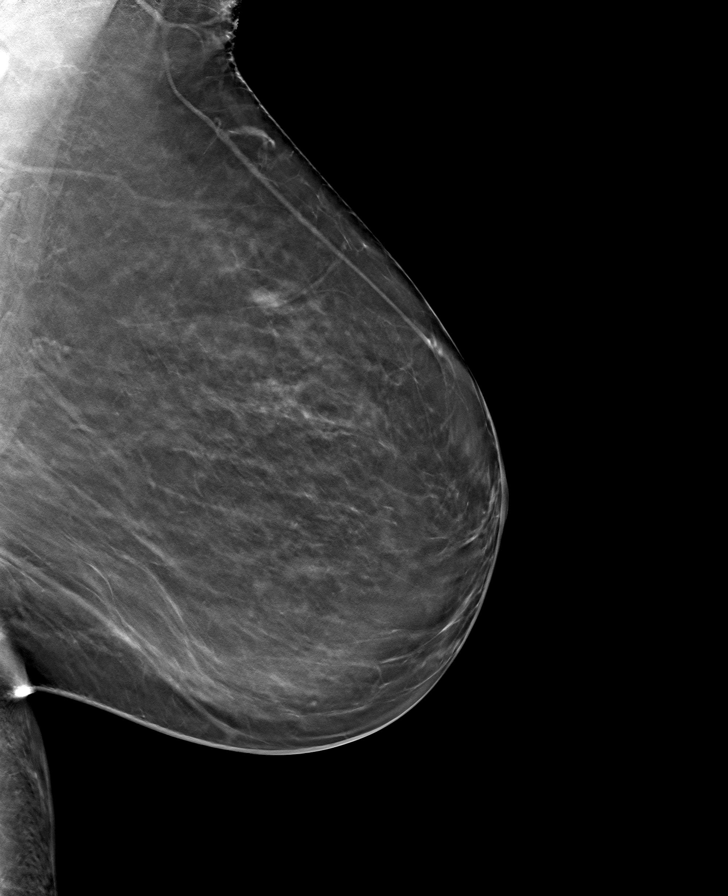

[R CC tomo · tomo slice 33/66.0]
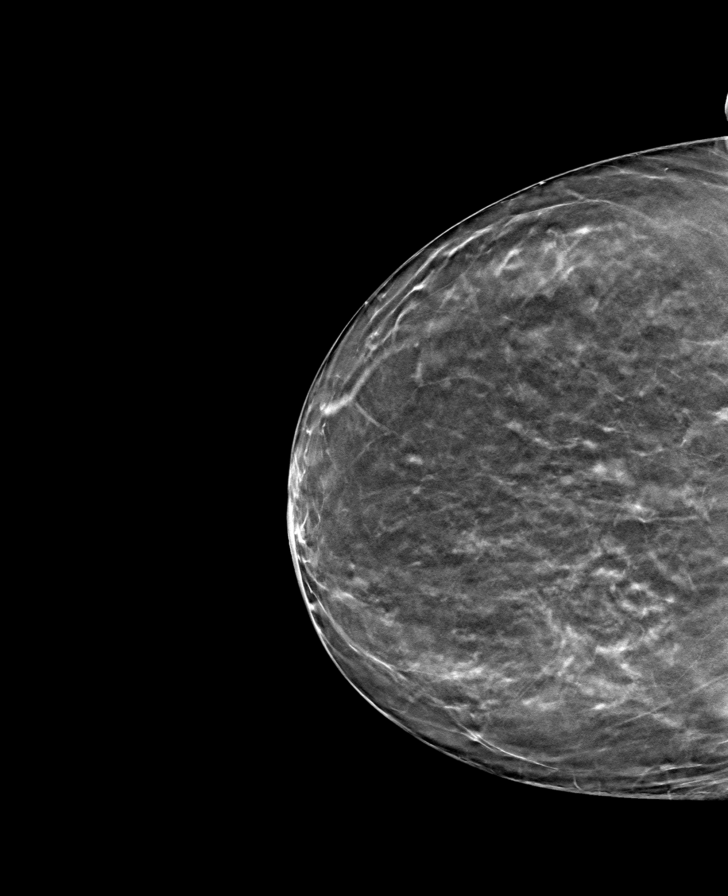

[8 of 24 positions shown; findings below may reference images not displayed]

ACR Breast Density Category b: There are scattered areas of
fibroglandular density.
FINDINGS: There are no findings suspicious for malignancy.
IMPRESSION: No mammographic evidence of malignancy. A result letter of this
screening mammogram will be mailed directly to the patient.

RECOMMENDATION:
Screening mammogram in one year. (Code:51-O-LD2)

BI-RADS CATEGORY  1: Negative.

## 2023-06-02 ENCOUNTER — Encounter: Payer: Self-pay | Admitting: Emergency Medicine

## 2023-06-09 ENCOUNTER — Other Ambulatory Visit: Payer: Self-pay

## 2023-06-20 ENCOUNTER — Other Ambulatory Visit: Payer: Self-pay

## 2023-06-23 ENCOUNTER — Ambulatory Visit: Payer: BC Managed Care – PPO | Admitting: Family Medicine

## 2023-07-16 ENCOUNTER — Ambulatory Visit: Payer: BC Managed Care – PPO | Admitting: Family Medicine

## 2023-09-08 ENCOUNTER — Ambulatory Visit: Payer: BC Managed Care – PPO | Admitting: Family Medicine

## 2023-10-30 ENCOUNTER — Ambulatory Visit: Payer: BC Managed Care – PPO | Admitting: Family Medicine

## 2023-10-30 ENCOUNTER — Encounter: Payer: Self-pay | Admitting: Family Medicine

## 2023-10-30 VITALS — BP 130/76 | HR 94 | Temp 98.2°F | Resp 18 | Ht 62.0 in | Wt 164.4 lb

## 2023-10-30 DIAGNOSIS — G8929 Other chronic pain: Secondary | ICD-10-CM

## 2023-10-30 DIAGNOSIS — M5441 Lumbago with sciatica, right side: Secondary | ICD-10-CM | POA: Diagnosis not present

## 2023-10-30 DIAGNOSIS — E559 Vitamin D deficiency, unspecified: Secondary | ICD-10-CM

## 2023-10-30 DIAGNOSIS — E669 Obesity, unspecified: Secondary | ICD-10-CM | POA: Diagnosis not present

## 2023-10-30 DIAGNOSIS — J42 Unspecified chronic bronchitis: Secondary | ICD-10-CM

## 2023-10-30 DIAGNOSIS — R7309 Other abnormal glucose: Secondary | ICD-10-CM

## 2023-10-30 DIAGNOSIS — Z1211 Encounter for screening for malignant neoplasm of colon: Secondary | ICD-10-CM

## 2023-10-30 DIAGNOSIS — Z114 Encounter for screening for human immunodeficiency virus [HIV]: Secondary | ICD-10-CM

## 2023-10-30 DIAGNOSIS — Z1159 Encounter for screening for other viral diseases: Secondary | ICD-10-CM

## 2023-10-30 DIAGNOSIS — Z13220 Encounter for screening for lipoid disorders: Secondary | ICD-10-CM | POA: Diagnosis not present

## 2023-10-30 DIAGNOSIS — E538 Deficiency of other specified B group vitamins: Secondary | ICD-10-CM

## 2023-10-30 DIAGNOSIS — Z1231 Encounter for screening mammogram for malignant neoplasm of breast: Secondary | ICD-10-CM

## 2023-10-30 DIAGNOSIS — Z0000 Encounter for general adult medical examination without abnormal findings: Secondary | ICD-10-CM
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
## 2023-10-30 NOTE — Patient Instructions (Addendum)
It was a pleasure meeting you today. Thank you for allowing me to take part in your health care.  Our goals for today as we discussed include:  Schedule lab appointment.  Fast for 10 hours  Referral sent for OBGYN   Referral sent for Mammogram. Please call to schedule appointment. [facility] [address] [place], [place] [address] [phone]    This is a list of the screening recommended for you and due dates:  Health Maintenance  Topic Date Due   HIV Screening  Never done   Hepatitis C Screening  Never done   Zoster (Shingles) Vaccine (1 of 2) Never done   Mammogram  12/11/2022   Flu Shot  Never done   Pap with HPV screening  06/09/2023   COVID-19 Vaccine (4 - 2024-25 season) 07/06/2023   DTaP/Tdap/Td vaccine (2 - Td or Tdap) 12/05/2024   Colon Cancer Screening  09/04/2028   HPV Vaccine  Aged Out    Follow up as needed   If you have any questions or concerns, please do not hesitate to call the office at [phone].  I look forward to our next visit and until then take care and stay safe.  Regards,   Dana Allan, MD   [facility]

## 2023-11-02 ENCOUNTER — Encounter: Payer: Self-pay | Admitting: Family Medicine

## 2023-11-02 DIAGNOSIS — R7309 Other abnormal glucose: Secondary | ICD-10-CM | POA: Insufficient documentation

## 2023-11-02 DIAGNOSIS — E559 Vitamin D deficiency, unspecified: Secondary | ICD-10-CM | POA: Insufficient documentation

## 2023-11-02 DIAGNOSIS — Z1231 Encounter for screening mammogram for malignant neoplasm of breast: Secondary | ICD-10-CM | POA: Insufficient documentation

## 2023-11-02 DIAGNOSIS — E669 Obesity, unspecified: Secondary | ICD-10-CM | POA: Insufficient documentation

## 2023-11-02 DIAGNOSIS — Z13220 Encounter for screening for lipoid disorders: Secondary | ICD-10-CM | POA: Insufficient documentation

## 2023-11-02 DIAGNOSIS — E538 Deficiency of other specified B group vitamins: Secondary | ICD-10-CM | POA: Insufficient documentation

## 2023-11-02 DIAGNOSIS — J42 Unspecified chronic bronchitis: Secondary | ICD-10-CM | POA: Insufficient documentation

## 2023-11-02 DIAGNOSIS — Z0000 Encounter for general adult medical examination without abnormal findings: Secondary | ICD-10-CM | POA: Insufficient documentation

## 2023-11-02 DIAGNOSIS — G8929 Other chronic pain: Secondary | ICD-10-CM | POA: Insufficient documentation

## 2023-11-02 DIAGNOSIS — Z1159 Encounter for screening for other viral diseases: Secondary | ICD-10-CM | POA: Insufficient documentation

## 2023-11-02 DIAGNOSIS — Z114 Encounter for screening for human immunodeficiency virus [HIV]: Secondary | ICD-10-CM | POA: Insufficient documentation

## 2023-11-02 DIAGNOSIS — Z1211 Encounter for screening for malignant neoplasm of colon: Secondary | ICD-10-CM | POA: Insufficient documentation

## 2023-11-02 NOTE — Assessment & Plan Note (Addendum)
Patient reports history of and uses Albuterol as needed. No current exacerbation or symptoms. -No further action required at this time.
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
## 2023-11-02 NOTE — Assessment & Plan Note (Signed)
Requesting referral to OBGYN for annual physical Previously followed by Dr Jean Rosenthal and would like to schedule with same provider

## 2023-11-06 ENCOUNTER — Other Ambulatory Visit (INDEPENDENT_AMBULATORY_CARE_PROVIDER_SITE_OTHER): Payer: 59

## 2023-11-06 DIAGNOSIS — E538 Deficiency of other specified B group vitamins: Secondary | ICD-10-CM

## 2023-11-06 DIAGNOSIS — E669 Obesity, unspecified: Secondary | ICD-10-CM | POA: Diagnosis not present

## 2023-11-06 DIAGNOSIS — E559 Vitamin D deficiency, unspecified: Secondary | ICD-10-CM | POA: Diagnosis not present

## 2023-11-06 DIAGNOSIS — R7309 Other abnormal glucose: Secondary | ICD-10-CM

## 2023-11-06 DIAGNOSIS — Z13220 Encounter for screening for lipoid disorders: Secondary | ICD-10-CM

## 2023-11-06 DIAGNOSIS — Z1159 Encounter for screening for other viral diseases: Secondary | ICD-10-CM

## 2023-11-06 DIAGNOSIS — Z114 Encounter for screening for human immunodeficiency virus [HIV]: Secondary | ICD-10-CM

## 2023-11-06 LAB — LIPID PANEL
Cholesterol: 307 mg/dL — ABNORMAL HIGH (ref 0–200)
HDL: 83.2 mg/dL (ref >39.00)
LDL Cholesterol: 209 mg/dL — ABNORMAL HIGH (ref 0–99)
NonHDL: 224.22
Total CHOL/HDL Ratio: 4
Triglycerides: 74 mg/dL (ref 0.0–149.0)
VLDL: 14.8 mg/dL (ref 0.0–40.0)

## 2023-11-06 LAB — COMPREHENSIVE METABOLIC PANEL
ALT: 11 U/L (ref 0–35)
AST: 14 U/L (ref 0–37)
Albumin: 4.4 g/dL (ref 3.5–5.2)
Alkaline Phosphatase: 88 U/L (ref 39–117)
BUN: 18 mg/dL (ref 6–23)
CO2: 27 meq/L (ref 19–32)
Calcium: 9.3 mg/dL (ref 8.4–10.5)
Chloride: 105 meq/L (ref 96–112)
Creatinine, Ser: 0.73 mg/dL (ref 0.40–1.20)
GFR: 89.12 mL/min (ref >60.00)
Glucose, Bld: 90 mg/dL (ref 70–99)
Potassium: 4.6 meq/L (ref 3.5–5.1)
Sodium: 141 meq/L (ref 135–145)
Total Bilirubin: 0.3 mg/dL (ref 0.2–1.2)
Total Protein: 7.3 g/dL (ref 6.0–8.3)

## 2023-11-06 LAB — CBC WITH DIFFERENTIAL/PLATELET
Basophils Absolute: 0 10*3/uL (ref 0.0–0.1)
Basophils Relative: 1.1 % (ref 0.0–3.0)
Eosinophils Absolute: 0.1 10*3/uL (ref 0.0–0.7)
Eosinophils Relative: 1.7 % (ref 0.0–5.0)
HCT: 38.5 % (ref 36.0–46.0)
Hemoglobin: 12.5 g/dL (ref 12.0–15.0)
Lymphocytes Relative: 40.1 % (ref 12.0–46.0)
Lymphs Abs: 1.3 10*3/uL (ref 0.7–4.0)
MCHC: 32.5 g/dL (ref 30.0–36.0)
MCV: 91.4 fL (ref 78.0–100.0)
Monocytes Absolute: 0.2 10*3/uL (ref 0.1–1.0)
Monocytes Relative: 6.7 % (ref 3.0–12.0)
Neutro Abs: 1.7 10*3/uL (ref 1.4–7.7)
Neutrophils Relative %: 50.4 % (ref 43.0–77.0)
Platelets: 281 10*3/uL (ref 150.0–400.0)
RBC: 4.21 Mil/uL (ref 3.87–5.11)
RDW: 13.6 % (ref 11.5–15.5)
WBC: 3.3 10*3/uL — ABNORMAL LOW (ref 4.0–10.5)

## 2023-11-06 LAB — VITAMIN D 25 HYDROXY (VIT D DEFICIENCY, FRACTURES): VITD: 19.38 ng/mL — ABNORMAL LOW (ref 30.00–100.00)

## 2023-11-06 LAB — VITAMIN B12: Vitamin B-12: 595 pg/mL (ref 211–911)

## 2023-11-06 LAB — HEMOGLOBIN A1C: Hgb A1c MFr Bld: 5.6 % (ref 4.6–6.5)

## 2023-11-07 ENCOUNTER — Other Ambulatory Visit: Payer: Self-pay | Admitting: Family Medicine

## 2023-11-07 DIAGNOSIS — E559 Vitamin D deficiency, unspecified: Secondary | ICD-10-CM

## 2023-11-07 DIAGNOSIS — D72819 Decreased white blood cell count, unspecified: Secondary | ICD-10-CM

## 2023-11-07 MED ORDER — VITAMIN D (ERGOCALCIFEROL) 1.25 MG (50000 UNIT) PO CAPS: 50000.0000 [IU] | ORAL_CAPSULE | ORAL | 1 refills | Status: DC

## 2023-11-08 LAB — HIV ANTIBODY (ROUTINE TESTING W REFLEX): HIV 1&2 Ab, 4th Generation: NONREACTIVE

## 2023-11-08 LAB — HEPATITIS C ANTIBODY: Hepatitis C Ab: NONREACTIVE

## 2023-11-13 ENCOUNTER — Telehealth: Payer: Self-pay

## 2023-11-13 ENCOUNTER — Ambulatory Visit
Admission: RE | Admit: 2023-11-13 | Discharge: 2023-11-13 | Disposition: A | Discharge status: Home / Self Care | Payer: 59 | Source: Ambulatory Visit | Attending: Family Medicine | Admitting: Family Medicine

## 2023-11-13 DIAGNOSIS — Z1231 Encounter for screening mammogram for malignant neoplasm of breast: Secondary | ICD-10-CM | POA: Diagnosis present

## 2023-11-13 NOTE — Telephone Encounter (Signed)
 Copied from CRM [id number]. Topic: General - Other >> Nov 13, 2023 11:30 AM Olivia Pennington wrote: Reason for CRM: Pt called stating she wanted to talk to the provider on Monday but she has not called the pt back yet

## 2023-11-14 NOTE — Telephone Encounter (Signed)
 Patient has not received response from Olivia message sent on 11/10/23.  Lab notes attached below:   Olivia Pennington, [facility] 11/10/2023  3:14 PM EST Back to Top    Spoke to pt and went over results in detail pt verbalized understanding pt does not want to start any cholesterol medication would like to try and do it naturally  if possible or if you can recommend something natural otc and would like to know why she needs to come back in for labs. Pt also stated that she needs to speak to you about her back that ya'll had previously discussed.   [name], MD 11/07/2023  4:48 PM EST     Vitamin D  low.  Start Vitamin D  1.25 mg weekly for 6 months then switch to over the  counter Vitamin D  800iu daily.   Total cholesterol was high at 307 (nml is <200) and LDL (bad) cholesterol was 790 which is also elevated (nml is <99).  You can decrease these numbers by: -limiting your intake of processed foods and foods high in saturated fats. -increasing your physical activity -increasing your intake of vegetables, if not allergic, healthy fish (bluefin tuna, wild salmon, and sardines), whole grains and fiber rich foods .   -You can also use extra virgin olive oil instead of butter. -If you smoke, quitting can decrease LDL cholesterol and raise HDL cholesterol.   LDL, bad cholesterol >190.  Recommend she start cholesterol medication.  If agreeable will send in prescription for Crestor daily.   White blood cells slightly low.  I am not too concerned but would like to repeat this in 1-2 months.  Please schedule lab appointment to have completed.   All other blood work acceptable

## 2023-11-18 ENCOUNTER — Telehealth: Payer: Self-pay

## 2023-11-18 NOTE — Telephone Encounter (Signed)
 Copied from CRM [id number]. Topic: Appointments - Transfer of Care >> Nov 18, 2023  9:30 AM Russell PARAS wrote: Pt is requesting to transfer FROM: Dr. [name] Pt is requesting to transfer TO: Dr. Leron Joseph Glance Reason for requested transfer: Has been trying to schedule appt to speak with provider about lab results. Has spoken with 2 nurses concerning the results and requested a virtual visit to review. No one has contacted her back in a week. She is a new patient and wants someone who will be more tentative with her healthcare. She would like to speak with someone concerning her cholesterol, and ways to manage it. She requests an appt next week if possible. She has also not received a referral from provider for gynecology, which was offered on 12/27 on her new patient appt.  It is the responsibility of the team the patient would like to transfer to (Dr. Glance) to reach out to the patient if for any reason this transfer is not acceptable.

## 2023-11-18 NOTE — Telephone Encounter (Signed)
 Pt scheduled for 01/16 & 7:40 am

## 2023-11-18 NOTE — Telephone Encounter (Signed)
 Pt scheduled for 01/16 at 2pm

## 2023-11-18 NOTE — Telephone Encounter (Signed)
 Noted.

## 2023-11-18 NOTE — Telephone Encounter (Signed)
 Patient does not wish to transfer spoke with patient and advised PCP has been out of office and explained the concerns patient had. Dr. Hope patient has FU scheduled to discuss on Thursday either alter native treatment for cholesterol or Medication and to discuss her WBC.

## 2023-11-20 ENCOUNTER — Ambulatory Visit (INDEPENDENT_AMBULATORY_CARE_PROVIDER_SITE_OTHER): Payer: 59 | Admitting: Family Medicine

## 2023-11-20 ENCOUNTER — Encounter: Payer: Self-pay | Admitting: Family Medicine

## 2023-11-20 VITALS — BP 146/80 | HR 77 | Temp 97.9°F | Ht 62.0 in | Wt 160.0 lb

## 2023-11-20 DIAGNOSIS — Z136 Encounter for screening for cardiovascular disorders: Secondary | ICD-10-CM

## 2023-11-20 DIAGNOSIS — E559 Vitamin D deficiency, unspecified: Secondary | ICD-10-CM

## 2023-11-20 DIAGNOSIS — R030 Elevated blood-pressure reading, without diagnosis of hypertension: Secondary | ICD-10-CM

## 2023-11-20 DIAGNOSIS — E785 Hyperlipidemia, unspecified: Secondary | ICD-10-CM

## 2023-11-20 DIAGNOSIS — J0111 Acute recurrent frontal sinusitis: Secondary | ICD-10-CM | POA: Diagnosis not present

## 2023-11-20 DIAGNOSIS — D72819 Decreased white blood cell count, unspecified: Secondary | ICD-10-CM

## 2023-11-20 MED ORDER — FLUTICASONE PROPIONATE 50 MCG/ACT NA SUSP: 2.0000 | Freq: Every day | NASAL | 6 refills | Status: AC

## 2023-11-20 MED ORDER — SACCHAROMYCES BOULARDII 250 MG PO CAPS: 250.0000 mg | ORAL_CAPSULE | Freq: Every day | ORAL | 0 refills | Status: AC

## 2023-11-20 MED ORDER — AMOXICILLIN-POT CLAVULANATE 875-125 MG PO TABS: 1.0000 | ORAL_TABLET | Freq: Two times a day (BID) | ORAL | 0 refills | Status: AC

## 2023-11-20 NOTE — Progress Notes (Signed)
SUBJECTIVE:   Chief Complaint  Patient presents with   Medical Management of Chronic Issues    labs   HPI Presents to clinic to review abnormal labs   Discussed the use of AI scribe software for clinical note transcription with the patient, who gave verbal consent to proceed.  History of Present Illness The patient, a 63 year old individual with a recent diagnosis of hypercholesterolemia, presented for a follow-up consultation regarding her elevated cholesterol levels. She expressed a strong preference for non-pharmacological interventions, specifically mentioning interest in the [facility] lipid low glycemic diet. Despite this, she remained hesitant about starting statin therapy, expressing concerns about potential side effects, particularly muscle aches, and the possibility of becoming dependent on the medication.  The patient acknowledged a lack of regular exercise and a diet high in trans fats as potential contributors to her elevated cholesterol. She expressed a desire to improve her lifestyle habits, including increasing vegetable intake and incorporating regular exercise. She also mentioned a recent retirement, which she believes could provide her with more time to focus on her health.  In addition to hypercholesterolemia, the patient reported symptoms suggestive of a sinus infection, including nasal congestion, pressure in the sinuses, and blood-tinged mucus. She noted that these symptoms had been present for approximately four weeks and had progressively worsened. The patient also reported taking over-the-counter medications, including DayQuil, NyQuil, and cortisone, to manage these symptoms.  The patient also reported a family history of diabetes, hypertension, and stroke, which she acknowledged could predispose her to similar health issues. She expressed a desire to take a proactive approach to her health, including considering a coronary calcium scan to assess her cardiovascular  risk.  Lastly, the patient reported a recent weight loss of four pounds, which she attributed to changes in her diet and lifestyle since her last visit. She expressed a desire to continue these positive changes and work towards improving her overall health.   PERTINENT PMH / PSH: As above  OBJECTIVE:  BP (!) 146/80   Pulse 77   Temp 97.9 F (36.6 C) (Oral)   Ht 5\' 2"  (1.575 m)   Wt 160 lb (72.6 kg)   LMP 04/23/2017 Comment: pt denies pregnancy-saw her physician and was told she has symptoms of menopause  SpO2 96%   BMI 29.26 kg/m    Physical Exam Vitals reviewed.  Constitutional:      General: She is not in acute distress.    Appearance: Normal appearance. She is normal weight. She is not ill-appearing, toxic-appearing or diaphoretic.  Eyes:     General:        Right eye: No discharge.        Left eye: No discharge.     Conjunctiva/sclera: Conjunctivae normal.  Cardiovascular:     Rate and Rhythm: Normal rate and regular rhythm.     Heart sounds: Normal heart sounds.  Pulmonary:     Effort: Pulmonary effort is normal.     Breath sounds: Normal breath sounds.  Musculoskeletal:        General: Normal range of motion.  Skin:    General: Skin is warm and dry.  Neurological:     General: No focal deficit present.     Mental Status: She is alert and oriented to person, place, and time. Mental status is at baseline.  Psychiatric:        Mood and Affect: Mood normal.        Behavior: Behavior normal.  Thought Content: Thought content normal.        Judgment: Judgment normal.           11/20/2023    7:58 AM 10/30/2023   10:41 AM 09/03/2021    9:53 AM 06/08/2018   10:20 AM  Depression screen PHQ 2/9  Decreased Interest 0 0 0 0  Down, Depressed, Hopeless 0 0 0 0  PHQ - 2 Score 0 0 0 0  Altered sleeping 0 0 0 0  Tired, decreased energy 0 0 0 0  Change in appetite 0 0 0 0  Feeling bad or failure about yourself  0 0 0 0  Trouble concentrating 0 0 0 0  Moving  slowly or fidgety/restless 0 0 0 0  Suicidal thoughts 0 0 0 0  PHQ-9 Score 0 0 0 0  Difficult doing work/chores Not difficult at all Not difficult at all        11/20/2023    7:59 AM 10/30/2023   10:41 AM  GAD 7 : Generalized Anxiety Score  Nervous, Anxious, on Edge 0 0  Control/stop worrying 0 0  Worry too much - different things 0 0  Trouble relaxing 0 0  Restless 0 0  Easily annoyed or irritable 0 0  Afraid - awful might happen 0 0  Total GAD 7 Score 0 0  Anxiety Difficulty Not difficult at all Not difficult at all    ASSESSMENT/PLAN:  Hyperlipidemia, unspecified hyperlipidemia type Assessment & Plan: LDL >190, discussed the risk of cardiovascular disease and the benefits of statin therapy. Patient hesitant about starting medication due to potential side effects and dependency. Discussed the possibility of diet and exercise to lower cholesterol levels, but emphasized the need for quicker reduction of LDL levels due to high cardiovascular risk. -Recommended starting Crestor (statin), but patient decision pending. -Encouraged diet and exercise, specifically low trans fat diet and cardio exercises. -Order coronary calcium scan to assess for plaque buildup in coronary arteries.   Screening for ischemic heart disease (IHD) -     CT CARDIAC SCORING (SELF PAY ONLY)  Acute recurrent frontal sinusitis Assessment & Plan: Patient reports symptoms consistent with sinusitis including pressure in sinuses, nasal bleeding, and mucus production. No fever reported. -Prescribe antibiotics and Flonase. -Recommend probiotics during and for two weeks after antibiotic course.  Orders: -     Amoxicillin-Pot Clavulanate; Take 1 tablet by mouth 2 (two) times daily for 5 days.  Dispense: 10 tablet; Refill: 0 -     Fluticasone Propionate; Place 2 sprays into both nostrils daily.  Dispense: 16 g; Refill: 6 -     Saccharomyces boulardii; Take 1 capsule (250 mg total) by mouth daily.  Dispense: 90  capsule; Refill: 0  Elevated blood pressure reading Assessment & Plan: Blood pressure elevated at current visit (140/90). Discussed the need for potential antihypertensive medication if consistently above 140/90. Has had elevated readings in past. Patient declines medication and prefers holistic method.  -Advise patient to monitor blood pressure at home or at local pharmacy. -Report values to office for further management.   Leukopenia, unspecified type Assessment & Plan: Recent WBC slightly low.  Asymptomatic and all other labs reassuring -Repeat CBC in 1-2 weeks   Vitamin D deficiency Assessment & Plan: Patient currently on weekly Vitamin D supplementation. -Continue current regimen.    PDMP reviewed  Return if symptoms worsen or fail to improve, for PCP.  Dana Allan, MD

## 2023-11-20 NOTE — Patient Instructions (Addendum)
It was a pleasure meeting you today. Thank you for allowing me to take part in your health care.  Our goals for today as we discussed include:  Calcium score/ Cardiac CT.  This is a 3D image of the heart that measures the calcium deposits in the coronary arteries to determine risk of heart disease. It is a $99 self-pay exam . The imaging is done at [facility].   Order was placed.  They will call you with appointment.  Call to schedule appointment with Dr Jean Rosenthal  [facility], [place]  [phone]  Start Augmentin 2 times a day for sinuses Start Probiotics daily Start Flonase 2 sprays in each nostril daily  Schedule lab appointment in 1 month for repeat CBC  This is a list of the screening recommended for you and due dates:  Health Maintenance  Topic Date Due   Pneumococcal Vaccination (1 of 2 - PCV) Never done   Zoster (Shingles) Vaccine (1 of 2) Never done   Pap with HPV screening  06/09/2023   COVID-19 Vaccine (4 - 2024-25 season) 07/06/2023   Flu Shot  02/02/2024*   DTaP/Tdap/Td vaccine (2 - Td or Tdap) 12/05/2024   Mammogram  11/12/2025   Colon Cancer Screening  09/04/2028   Hepatitis C Screening  Completed   HIV Screening  Completed   HPV Vaccine  Aged Out  *Topic was postponed. The date shown is not the original due date.    If you have any questions or concerns, please do not hesitate to call the office at [phone].  I look forward to our next visit and until then take care and stay safe.  Regards,   Dana Allan, MD   [facility]

## 2023-11-28 ENCOUNTER — Other Ambulatory Visit: Payer: 59

## 2023-11-30 ENCOUNTER — Encounter: Payer: Self-pay | Admitting: Family Medicine

## 2023-11-30 DIAGNOSIS — E785 Hyperlipidemia, unspecified: Secondary | ICD-10-CM | POA: Insufficient documentation

## 2023-11-30 DIAGNOSIS — D72819 Decreased white blood cell count, unspecified: Secondary | ICD-10-CM | POA: Insufficient documentation

## 2023-11-30 DIAGNOSIS — J0111 Acute recurrent frontal sinusitis: Secondary | ICD-10-CM | POA: Insufficient documentation

## 2023-11-30 DIAGNOSIS — Z136 Encounter for screening for cardiovascular disorders: Secondary | ICD-10-CM | POA: Insufficient documentation

## 2023-11-30 DIAGNOSIS — R030 Elevated blood-pressure reading, without diagnosis of hypertension: Secondary | ICD-10-CM | POA: Insufficient documentation

## 2023-11-30 NOTE — Assessment & Plan Note (Signed)
LDL >190, discussed the risk of cardiovascular disease and the benefits of statin therapy. Patient hesitant about starting medication due to potential side effects and dependency. Discussed the possibility of diet and exercise to lower cholesterol levels, but emphasized the need for quicker reduction of LDL levels due to high cardiovascular risk. -Recommended starting Crestor (statin), but patient decision pending. -Encouraged diet and exercise, specifically low trans fat diet and cardio exercises. -Order coronary calcium scan to assess for plaque buildup in coronary arteries.

## 2023-11-30 NOTE — Assessment & Plan Note (Signed)
Patient reports symptoms consistent with sinusitis including pressure in sinuses, nasal bleeding, and mucus production. No fever reported. -Prescribe antibiotics and Flonase. -Recommend probiotics during and for two weeks after antibiotic course.

## 2023-11-30 NOTE — Assessment & Plan Note (Signed)
Recent WBC slightly low.  Asymptomatic and all other labs reassuring -Repeat CBC in 1-2 weeks

## 2023-11-30 NOTE — Assessment & Plan Note (Signed)
Blood pressure elevated at current visit (140/90). Discussed the need for potential antihypertensive medication if consistently above 140/90. Has had elevated readings in past. Patient declines medication and prefers holistic method.  -Advise patient to monitor blood pressure at home or at local pharmacy. -Report values to office for further management.

## 2023-11-30 NOTE — Assessment & Plan Note (Signed)
Patient currently on weekly Vitamin D supplementation. -Continue current regimen.

## 2023-12-26 ENCOUNTER — Other Ambulatory Visit: Payer: Self-pay

## 2023-12-30 ENCOUNTER — Ambulatory Visit
Admission: RE | Admit: 2023-12-30 | Discharge: 2023-12-30 | Disposition: A | Discharge status: Home / Self Care | Payer: Self-pay | Source: Ambulatory Visit | Attending: Family Medicine | Admitting: Family Medicine

## 2023-12-30 DIAGNOSIS — Z136 Encounter for screening for cardiovascular disorders: Secondary | ICD-10-CM | POA: Insufficient documentation

## 2024-04-03 ENCOUNTER — Other Ambulatory Visit: Payer: Self-pay | Admitting: Family Medicine

## 2024-04-03 DIAGNOSIS — E559 Vitamin D deficiency, unspecified: Secondary | ICD-10-CM

## 2024-11-01 ENCOUNTER — Encounter

## 2024-12-28 ENCOUNTER — Encounter
